# Patient Record
Sex: Male | Born: 2000 | Race: Black or African American | Hispanic: No | Marital: Single | Smoking: Never smoker
Health system: Southern US, Community
[De-identification: ages and names within clinical notes are randomized; demographics above are authoritative.]

## PROBLEM LIST (undated history)

## (undated) DIAGNOSIS — J45909 Unspecified asthma, uncomplicated: Secondary | ICD-10-CM

## (undated) DIAGNOSIS — Q213 Tetralogy of Fallot: Secondary | ICD-10-CM

## (undated) HISTORY — PX: WISDOM TOOTH EXTRACTION: SHX21

---
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
## 2022-01-27 NOTE — Progress Notes (Signed)
Cardiology Office Note ? ?Date:  01/28/2022  ? ?ID:  Chris Beck, DOB 11/30/2000, MRN [id number] ? ?PCP:  Dene Gentry, MD  ? ?Chief Complaint  ?Patient presents with  ? New Patient (Initial Visit)  ?  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  ?"Doing well." Medications reviewed by the patient verbally.    ? ?  ? ? ?HPI:  ?Mr. 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 ?Previously followed at [facility] pediatric congenital clinic ?Prior notes requested, reviewed ?Reports that he is very active, used to run track, continues his regular exercise program ?Denies any PND orthopnea, no chest pain on exertion no significant shortness of breath ? ?Last echo 01/2018 ?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  ?  No atrial shunt seen  ?  Mild right ventricular dilation with normal systolic function  ?  Normal left ventricualr size and systolic function  ?  No residual ventricular septal defect seen  ?  There is muscular narrowing in the right ventricular mid cavity with a peak gradient of  ?  22 mmHg  ?  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 ?The sinuses of Valsalva measure 38 mm. ? ?EKG personally reviewed by myself on todays visit ?Normal sinus rhythm rate 71 bpm right bundle branch block, RVH ? ? ? ?PMH:   has a past medical history of Asthma and Tetralogy of Fallot. ? ?PSH:   No past surgical history on  file. ? ?No current outpatient medications on file.  ? ?No current facility-administered medications for this visit.  ? ? ? ?Allergies:   Ibuprofen  ? ?Social History:  The patient  reports that he has never smoked. He has never used smokeless tobacco. He reports that he does not drink alcohol and does not use drugs.  ? ?Family History:   family history is not on file.  ? ? ?Review of Systems: ?Review of Systems  ?Constitutional: Negative.   ?HENT: Negative.    ?Respiratory: Negative.    ?Cardiovascular: Negative.   ?Gastrointestinal: Negative.   ?Musculoskeletal: Negative.   ?Neurological: Negative.   ?Psychiatric/Behavioral: Negative.    ?All other systems reviewed and are negative. ? ? ?PHYSICAL EXAM: ?VS:  BP 110/80 (BP Location: Right Arm, Patient Position: Sitting, Cuff Size: Normal)   Pulse 71   Ht 5\' 7"  (1.702 m)   Wt 128 lb (58.1 kg)   SpO2 99%   BMI 20.05 kg/m?  , BMI Body mass index is 20.05 kg/m?. ?GEN: Well nourished, well developed, in no acute distress ?HEENT: normal ?Neck: no JVD, carotid bruits, or masses ?Cardiac: RRR; 99991111 systolic ejection murmur left sternal border  ?No rubs, or gallops,no edema  ?Respiratory:  clear to auscultation bilaterally, normal work of breathing ?GI: soft, nontender, nondistended, + BS ?MS: no deformity or atrophy ?Skin: warm and dry, no rash ?Neuro:  Strength and sensation are intact ?Psych: euthymic mood, full affect ? ? ?  Recent Labs: ?No results found for requested labs within last 8760 hours.  ? ? ?Lipid Panel ?No results found for: CHOL, HDL, LDLCALC, TRIG ?  ? ?Wt Readings from Last 3 Encounters:  ?01/28/22 128 lb (58.1 kg) (10 %, Z= -1.27)*  ?07/27/18 131 lb (59.4 kg) (45 %, Z= -0.14)*  ? ?* Growth percentiles are based on CDC (Boys, 2-20 Years) data.  ?  ? ? ?ASSESSMENT AND PLAN: ? ?Problem List Items Addressed This Visit   ?None ?Visit Diagnoses   ? ? Tetralogy of Fallot s/p repair    -  Primary  ? Relevant Orders  ? ECHOCARDIOGRAM COMPLETE  ? Autism       ? Pulmonary valve insufficiency, unspecified etiology      ? ?  ? ?Prior diagnosis and surgery discussed, ?Has good exercise tolerance with no symptoms ?Denies edema, PND orthopnea ?Repeat echocardiogram has been ordered, last study 4 years ago ? ? ? Total encounter time more than 60 minutes ? Greater than 50% was spent in counseling and coordination of care with the patient ? ? ? ?Signed, ?Esmond Plants, M.D., Ph.D. ?[facility] [place], [place] ?[phone] ?

## 2022-01-28 ENCOUNTER — Ambulatory Visit (INDEPENDENT_AMBULATORY_CARE_PROVIDER_SITE_OTHER): Payer: Self-pay | Admitting: Cardiovascular Disease

## 2022-01-28 ENCOUNTER — Encounter: Payer: Self-pay | Admitting: Cardiovascular Disease

## 2022-01-28 ENCOUNTER — Other Ambulatory Visit: Payer: Self-pay

## 2022-01-28 VITALS — BP 110/80 | HR 71 | Ht 67.0 in | Wt 128.0 lb

## 2022-01-28 DIAGNOSIS — Z8774 Personal history of (corrected) congenital malformations of heart and circulatory system: Secondary | ICD-10-CM

## 2022-01-28 DIAGNOSIS — I371 Nonrheumatic pulmonary valve insufficiency: Secondary | ICD-10-CM

## 2022-01-28 DIAGNOSIS — F840 Autistic disorder: Secondary | ICD-10-CM

## 2022-01-28 NOTE — Patient Instructions (Addendum)
Echo for TOF/congenital heart disease, s/p correction ? ? ?Medication Instructions:  ?No changes ? ?If you need a refill on your cardiac medications before your next appointment, please call your pharmacy.  ? ?Lab work: ?No new labs needed ? ?Testing/Procedures: ? ?1) Echocardiogram: ?- Your physician has requested that you have an echocardiogram. Echocardiography is a painless test that uses sound waves to create images of your heart. It provides your doctor with information about the size and shape of your heart and how well your heart?s chambers and valves are working. This procedure takes approximately one hour. There are no restrictions for this procedure. There is a possibility that an IV may need to be started during your test to inject an image enhancing agent. This is done to obtain more optimal pictures of your heart. Therefore we ask that you do at least drink some water prior to coming in to hydrate your veins.  ? ? ?Follow-Up: ?At [facility], you and your health needs are our priority.  As part of our continuing mission to provide you with exceptional heart care, we have created designated Provider Care Teams.  These Care Teams include your primary Cardiologist (physician) and Advanced Practice Providers (APPs -  Physician Assistants and Nurse Practitioners) who all work together to provide you with the care you need, when you need it. ? ?You will need a follow up appointment in 12 months ? ?Providers on your designated Care Team:   ?Nicolasa Ducking, NP ?Eula Listen, PA-C ?Cadence Fransico Michael, PA-C ? ?COVID-19 Vaccine Information can be found at: [email or web address] For questions related to vaccine distribution or appointments, please email [email or web address] or call [phone].  ? ?

## 2022-02-01 NOTE — Addendum Note (Signed)
Addended byAlvis Lemmings C on: 02/01/2022 04:25 PM ? ? Modules accepted: Orders ? ?

## 2022-02-02 ENCOUNTER — Other Ambulatory Visit: Payer: Self-pay

## 2022-02-02 DIAGNOSIS — Z8774 Personal history of (corrected) congenital malformations of heart and circulatory system: Secondary | ICD-10-CM

## 2022-02-02 NOTE — Progress Notes (Signed)
Per Dr. Rockey Situ can we reschedule his echocardiogram to [facility] with attention to specific echo reader Harrington Challenger or Margaretann Loveless . Thx ? ?New order placed for the [place] location  ?

## 2022-02-07 NOTE — Addendum Note (Signed)
Addended by: Festus Aloe on: 02/07/2022 08:52 AM ? ? Modules accepted: Orders ? ?

## 2022-02-09 ENCOUNTER — Other Ambulatory Visit (HOSPITAL_COMMUNITY): Payer: Self-pay

## 2022-02-11 ENCOUNTER — Other Ambulatory Visit: Payer: Self-pay

## 2022-02-17 ENCOUNTER — Ambulatory Visit (HOSPITAL_COMMUNITY): Payer: Self-pay | Attending: Cardiovascular Disease

## 2022-02-17 DIAGNOSIS — Z8774 Personal history of (corrected) congenital malformations of heart and circulatory system: Secondary | ICD-10-CM | POA: Insufficient documentation

## 2022-02-17 DIAGNOSIS — I361 Nonrheumatic tricuspid (valve) insufficiency: Secondary | ICD-10-CM

## 2022-02-18 LAB — ECHOCARDIOGRAM COMPLETE
Area-P 1/2: 5.54 cm2
S' Lateral: 2.7 cm

## 2022-02-21 ENCOUNTER — Telehealth: Payer: Self-pay | Admitting: Emergency Medicine

## 2022-02-21 NOTE — Telephone Encounter (Signed)
Called and spoke with patient's mother, per DPR. Results reviewed with patient's mother, she verbalized understanding,  questions (if any) answered.   ?

## 2022-02-21 NOTE — Telephone Encounter (Signed)
-----   Message from Antonieta Iba, MD sent at 02/20/2022  1:43 PM EDT ----- ?Echocardiogram ?Normal ejection fraction ?Mild dilation of ascending aorta measured 42 mm ?Mild narrowing of left ventricular outflow tract ?No significant valve issues noted ?The above can be monitored with periodic echocardiogram  ?

## 2022-12-13 ENCOUNTER — Other Ambulatory Visit: Payer: Self-pay | Admitting: Family

## 2022-12-13 ENCOUNTER — Encounter: Payer: Self-pay | Admitting: Family

## 2022-12-13 DIAGNOSIS — M2140 Flat foot [pes planus] (acquired), unspecified foot: Secondary | ICD-10-CM | POA: Insufficient documentation

## 2022-12-13 DIAGNOSIS — L309 Dermatitis, unspecified: Secondary | ICD-10-CM | POA: Insufficient documentation

## 2022-12-13 DIAGNOSIS — K004 Disturbances in tooth formation: Secondary | ICD-10-CM | POA: Insufficient documentation

## 2022-12-13 DIAGNOSIS — F845 Asperger's syndrome: Secondary | ICD-10-CM | POA: Insufficient documentation

## 2022-12-13 DIAGNOSIS — J309 Allergic rhinitis, unspecified: Secondary | ICD-10-CM | POA: Insufficient documentation

## 2022-12-14 LAB — COMPREHENSIVE METABOLIC PANEL
ALT: 18 IU/L (ref 0–44)
AST: 29 IU/L (ref 0–40)
Albumin/Globulin Ratio: 2.3 — ABNORMAL HIGH (ref 1.2–2.2)
Albumin: 4.9 g/dL (ref 4.3–5.2)
Alkaline Phosphatase: 59 IU/L (ref 51–125)
BUN/Creatinine Ratio: 15 (ref 9–20)
BUN: 15 mg/dL (ref 6–20)
Bilirubin Total: 0.9 mg/dL (ref 0.0–1.2)
CO2: 25 mmol/L (ref 20–29)
Calcium: 9.6 mg/dL (ref 8.7–10.2)
Chloride: 102 mmol/L (ref 96–106)
Creatinine, Ser: 1.03 mg/dL (ref 0.76–1.27)
Globulin, Total: 2.1 g/dL (ref 1.5–4.5)
Glucose: 94 mg/dL (ref 70–99)
Potassium: 4.1 mmol/L (ref 3.5–5.2)
Sodium: 140 mmol/L (ref 134–144)
Total Protein: 7 g/dL (ref 6.0–8.5)
eGFR: 107 mL/min/{1.73_m2} (ref >59)

## 2022-12-14 LAB — CBC WITH DIFFERENTIAL/PLATELET
Basophils Absolute: 0.1 10*3/uL (ref 0.0–0.2)
Basos: 1 %
EOS (ABSOLUTE): 0.4 10*3/uL (ref 0.0–0.4)
Eos: 7 %
Hematocrit: 42 % (ref 37.5–51.0)
Hemoglobin: 14.2 g/dL (ref 13.0–17.7)
Immature Grans (Abs): 0 10*3/uL (ref 0.0–0.1)
Immature Granulocytes: 0 %
Lymphocytes Absolute: 2 10*3/uL (ref 0.7–3.1)
Lymphs: 34 %
MCH: 29.8 pg (ref 26.6–33.0)
MCHC: 33.8 g/dL (ref 31.5–35.7)
MCV: 88 fL (ref 79–97)
Monocytes Absolute: 0.5 10*3/uL (ref 0.1–0.9)
Monocytes: 9 %
Neutrophils Absolute: 2.9 10*3/uL (ref 1.4–7.0)
Neutrophils: 49 %
Platelets: 323 10*3/uL (ref 150–450)
RBC: 4.76 x10E6/uL (ref 4.14–5.80)
RDW: 11.9 % (ref 11.6–15.4)
WBC: 6 10*3/uL (ref 3.4–10.8)

## 2023-01-25 ENCOUNTER — Ambulatory Visit: Payer: Self-pay | Attending: Cardiovascular Disease | Admitting: Cardiovascular Disease

## 2023-01-25 ENCOUNTER — Encounter: Payer: Self-pay | Admitting: Cardiovascular Disease

## 2023-01-25 VITALS — BP 110/74 | HR 52 | Ht 67.0 in | Wt 128.4 lb

## 2023-01-25 DIAGNOSIS — F845 Asperger's syndrome: Secondary | ICD-10-CM

## 2023-01-25 DIAGNOSIS — Z8774 Personal history of (corrected) congenital malformations of heart and circulatory system: Secondary | ICD-10-CM

## 2023-01-25 DIAGNOSIS — I517 Cardiomegaly: Secondary | ICD-10-CM

## 2023-01-25 DIAGNOSIS — I371 Nonrheumatic pulmonary valve insufficiency: Secondary | ICD-10-CM

## 2023-01-25 DIAGNOSIS — I77810 Thoracic aortic ectasia: Secondary | ICD-10-CM

## 2023-01-25 DIAGNOSIS — F840 Autistic disorder: Secondary | ICD-10-CM

## 2023-01-25 DIAGNOSIS — I370 Nonrheumatic pulmonary valve stenosis: Secondary | ICD-10-CM

## 2023-01-25 NOTE — Patient Instructions (Signed)
Medication Instructions:  No changes  If you need a refill on your cardiac medications before your next appointment, please call your pharmacy.   Lab work: No new labs needed  Testing/Procedures: No new testing needed  Follow-Up: At [facility], you and your health needs are our priority.  As part of our continuing mission to provide you with exceptional heart care, we have created designated Provider Care Teams.  These Care Teams include your primary Cardiologist (physician) and Advanced Practice Providers (APPs -  Physician Assistants and Nurse Practitioners) who all work together to provide you with the care you need, when you need it.  You will need a follow up appointment in 12 months  Providers on your designated Care Team:   Christopher Berge, NP Ryan Dunn, PA-C Cadence Furth, PA-C  COVID-19 Vaccine Information can be found at: [email or web address] For questions related to vaccine distribution or appointments, please email [email or web address] or call [phone].   

## 2023-01-25 NOTE — Progress Notes (Signed)
Cardiology Office Note  Date:  01/25/2023   ID:  ASHUTOSH SIRIGNANO, DOB 11/04/2001, MRN [id number]  PCP:  Mechele Claude, FNP   Chief Complaint  Patient presents with   12 month follow up     "Doing well." Medications reviewed by the patient verbally.     HPI:  Mr. 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    Who presents for new patient evaluation of his tetralogy of Fallot, status post repair  Previously followed at [facility] pediatric congenital clinic In follow-up today reports that he continues to be active, exercises regularly In college, likes to run but not participating in a sports club or program Denies significant shortness of breath on exertion, no chest pain, no leg swelling PND orthopnea  Echocardiogram April 2023 through our system Mild dilate ascending aorta 4.2 cm Mild narrowing left ventricular outflow tract  echo 01/2018 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  EKG personally reviewed by myself on todays visit Normal sinus rhythm rate 52 bpm right bundle branch block, RVH No significant change from prior EKG 2023 PMH:   has a past medical history of Asthma and Tetralogy of Fallot.  PSH:   History reviewed. No pertinent surgical history.  No current outpatient medications on file.   No  current facility-administered medications for this visit.     Allergies:   Ibuprofen   Social History:  The patient  reports that he has never smoked. He has never used smokeless tobacco. He reports that he does not drink alcohol and does not use drugs.   Family History:   family history is not on file.    Review of Systems: Review of Systems  Constitutional: Negative.   HENT: Negative.    Respiratory: Negative.    Cardiovascular: Negative.   Gastrointestinal: Negative.   Musculoskeletal: Negative.   Neurological: Negative.   Psychiatric/Behavioral: Negative.    All other systems reviewed and are negative.   PHYSICAL EXAM: VS:  BP 110/74 (BP Location: Left Arm, Patient Position: Sitting, Cuff Size: Normal)   Pulse (!) 52   Ht '5\' 7"'$  (1.702 m)   Wt 128 lb 6 oz (58.2 kg)   SpO2 98%   BMI 20.11 kg/m  , BMI Body mass index is 20.11 kg/m. Constitutional:  oriented to person, place, and time. No distress.  HENT:  Head: Grossly normal Eyes:  no discharge. No scleral icterus.  Neck: No JVD, no carotid bruits  Cardiovascular: RRR; 3/6 systolic ejection murmur left sternal border  Pulmonary/Chest: Clear to auscultation bilaterally, no wheezes or rails Abdominal: Soft.  no distension.  no tenderness.  Musculoskeletal: Normal range of motion Neurological:  normal muscle tone. Coordination normal. No atrophy Skin: Skin warm and dry Psychiatric: normal affect, pleasant   Recent Labs: 12/13/2022: ALT 18;  BUN 15; Creatinine, Ser 1.03; Hemoglobin 14.2; Platelets 323; Potassium 4.1; Sodium 140    Lipid Panel No results found for: "CHOL", "HDL", "LDLCALC", "TRIG"    Wt Readings from Last 3 Encounters:  01/25/23 128 lb 6 oz (58.2 kg)  01/28/22 128 lb (58.1 kg) (10 %, Z= -1.27)*  07/27/18 131 lb (59.4 kg) (45 %, Z= -0.14)*   * Growth percentiles are based on CDC (Boys, 2-20 Years) data.      ASSESSMENT AND PLAN:  Problem List Items Addressed This Visit       Cardiology  Problems   Aortic root dilatation [facility])   Pulmonary stenosis   Right ventricular dilation     Other   Asperger's disorder   Other Visit Diagnoses     Tetralogy of Fallot s/p repair    -  Primary   Autism       Pulmonary valve insufficiency, unspecified etiology          Tetralogy of Fallot status postrepair Presents today with his mother, reports no new symptoms concerning for progression of his cardiac disease Prior echocardiogram from April 2023 reviewed with him Discussed outflow tract gradient, pulmonic valve regurgitation, mild aortic root dilation good exercise tolerance with no new complaints Currently with no insurance, would prefer no repeat echo testing at this time Discussed discussed symptoms to watch for including PND orthopnea, leg swelling abdominal distention, worsening shortness of breath on exertion  Autism/asperger disorder Completing college August 2024 Enjoys writing   Total encounter time more than 30 minutes  Greater than 50% was spent in counseling and coordination of care with the patient    Signed, Chris Beck, M.D., Ph.D. [place], [place]

## 2023-02-03 ENCOUNTER — Ambulatory Visit: Payer: Self-pay | Admitting: Family

## 2023-02-03 VITALS — BP 112/62 | HR 63 | Ht 69.0 in | Wt 127.0 lb

## 2023-02-03 DIAGNOSIS — L308 Other specified dermatitis: Secondary | ICD-10-CM

## 2023-02-03 MED ORDER — TRIAMCINOLONE ACETONIDE 0.5 % EX CREA: 1.0000 | TOPICAL_CREAM | Freq: Two times a day (BID) | CUTANEOUS | 0 refills | Status: AC

## 2023-02-03 NOTE — Progress Notes (Signed)
Established Patient Office Visit  Subjective:  Patient ID: Chris Beck, male    DOB: 2001-10-25  Age: 22 y.o. MRN: [id number]  Chief Complaint  Patient presents with   Follow-up    Rash around eyes    Patient here with a rash on his face.  He says that he noticed some bumps yesterday, but that it worsened into a small rash at the outer corners of his eyes that has become quite itchy.  He does have a history of eczema, and the rash appears consistent with this.  It has however been several years since he has had any issues with this.   No other concerns today.     No other concerns at this time.   Past Medical History:  Diagnosis Date   Asthma    Tetralogy of Fallot     History reviewed. No pertinent surgical history.  Social History   Socioeconomic History   Marital status: Single    Spouse name: Not on file   Number of children: Not on file   Years of education: Not on file   Highest education level: Not on file  Occupational History   Not on file  Tobacco Use   Smoking status: Never   Smokeless tobacco: Never  Vaping Use   Vaping Use: Never used  Substance and Sexual Activity   Alcohol use: Never   Drug use: Never   Sexual activity: Never  Other Topics Concern   Not on file  Social History Narrative   Not on file   Social Determinants of Health   Financial Resource Strain: Not on file  Food Insecurity: Not on file  Transportation Needs: Not on file  Physical Activity: Not on file  Stress: Not on file  Social Connections: Not on file  Intimate Partner Violence: Not on file    History reviewed. No pertinent family history.  Allergies  Allergen Reactions   Ibuprofen Other (See Comments)    Rash  Other Reaction(s): Toxic shock syndrome    Review of Systems  Skin:  Positive for itching and rash.  All other systems reviewed and are negative.      Objective:   BP 112/62   Pulse 63   Ht 5\' 9"  (1.753 m)   Wt 127 lb (57.6 kg)    SpO2 99%   BMI 18.75 kg/m   Vitals:   02/03/23 1431  BP: 112/62  Pulse: 63  Height: 5\' 9"  (1.753 m)  Weight: 127 lb (57.6 kg)  SpO2: 99%  BMI (Calculated): 18.75    Physical Exam Vitals and nursing note reviewed.  Constitutional:      Appearance: Normal appearance. He is normal weight.  Eyes:     Pupils: Pupils are equal, round, and reactive to light.  Cardiovascular:     Rate and Rhythm: Normal rate and regular rhythm.     Pulses: Normal pulses.     Heart sounds: Normal heart sounds.  Pulmonary:     Effort: Pulmonary effort is normal.     Breath sounds: Normal breath sounds.  Musculoskeletal:     Cervical back: Normal range of motion.  Skin:    Findings: Rash (located at outer corners of eyes. Appears dry and flaky.) present.  Neurological:     General: No focal deficit present.     Mental Status: He is alert and oriented to person, place, and time. Mental status is at baseline.  Psychiatric:        Mood and  Affect: Mood normal.        Behavior: Behavior normal.      No results found for any visits on 02/03/23.  Recent Results (from the past 2160 hour(s))  CBC with Differential/Platelet     Status: None   Collection Time: 12/13/22  3:39 PM  Result Value Ref Range   WBC 6.0 3.4 - 10.8 x10E3/uL   RBC 4.76 4.14 - 5.80 x10E6/uL   Hemoglobin 14.2 13.0 - 17.7 g/dL   Hematocrit 42.0 37.5 - 51.0 %   MCV 88 79 - 97 fL   MCH 29.8 26.6 - 33.0 pg   MCHC 33.8 31.5 - 35.7 g/dL   RDW 11.9 11.6 - 15.4 %   Platelets 323 150 - 450 x10E3/uL   Neutrophils 49 Not Estab. %   Lymphs 34 Not Estab. %   Monocytes 9 Not Estab. %   Eos 7 Not Estab. %   Basos 1 Not Estab. %   Neutrophils Absolute 2.9 1.4 - 7.0 x10E3/uL   Lymphocytes Absolute 2.0 0.7 - 3.1 x10E3/uL   Monocytes Absolute 0.5 0.1 - 0.9 x10E3/uL   EOS (ABSOLUTE) 0.4 0.0 - 0.4 x10E3/uL   Basophils Absolute 0.1 0.0 - 0.2 x10E3/uL   Immature Granulocytes 0 Not Estab. %   Immature Grans (Abs) 0.0 0.0 - 0.1 x10E3/uL   Comprehensive metabolic panel     Status: Abnormal   Collection Time: 12/13/22  3:39 PM  Result Value Ref Range   Glucose 94 70 - 99 mg/dL   BUN 15 6 - 20 mg/dL   Creatinine, Ser 1.03 0.76 - 1.27 mg/dL   eGFR 107 >59 mL/min/1.73   BUN/Creatinine Ratio 15 9 - 20   Sodium 140 134 - 144 mmol/L   Potassium 4.1 3.5 - 5.2 mmol/L   Chloride 102 96 - 106 mmol/L   CO2 25 20 - 29 mmol/L   Calcium 9.6 8.7 - 10.2 mg/dL   Total Protein 7.0 6.0 - 8.5 g/dL   Albumin 4.9 4.3 - 5.2 g/dL   Globulin, Total 2.1 1.5 - 4.5 g/dL   Albumin/Globulin Ratio 2.3 (H) 1.2 - 2.2   Bilirubin Total 0.9 0.0 - 1.2 mg/dL   Alkaline Phosphatase 59 51 - 125 IU/L   AST 29 0 - 40 IU/L   ALT 18 0 - 44 IU/L      Assessment & Plan:   Problem List Items Addressed This Visit     Eczema - Primary    Return as needed..   Total time spent: 20 minutes  Mechele Claude, FNP  02/03/2023

## 2023-02-04 ENCOUNTER — Encounter: Payer: Self-pay | Admitting: Family

## 2023-08-10 ENCOUNTER — Ambulatory Visit: Payer: Self-pay | Admitting: Family

## 2023-12-14 ENCOUNTER — Ambulatory Visit: Payer: Self-pay | Admitting: Family

## 2024-03-02 NOTE — Progress Notes (Unsigned)
 Cardiology Clinic Note   Date: 03/04/2024 ID: Chris Beck, DOB 2001/03/25, MRN [id number]  Primary Cardiologist:  Belva Boyden, MD  Chief Complaint   Chris Beck is a 23 y.o. male who presents to the clinic today for routine follow up.   Patient Profile   Chris Beck is followed by Dr. Gollan for the history outlined below.      Past medical history significant for: Thoracic ascending aortic dilatation/aortic root dilatation. Echo 02/17/2022: Septal hypokinesis.  EF 55%.  Indeterminate diastolic parameters.  Low normal RV function, normal RV size.  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  Mild narrowing through RVOT peak and mean gradients through the RVOT/pulmonic valve 16 and 7 mmHg respectively.  Aortic dilatation noted 38 mm.  Mild dilatation of ascending aorta 42 mm. 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  Asperger's.  In summary, patient was first evaluated by Dr. Gollan on 01/28/2022 to establish cardiac care.  Patient with a history of repair of tetralogy of Fallot in April 2024.  He was previously followed by [facility].  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  Patient was last seen in the office by Dr. Gollan on 01/25/2023 for routine follow-up.  He was doing well at that time and no changes were made.     History of Present Illness    Today, patient is accompanied by his mother. He is doing very well. Patient denies shortness of breath, dyspnea on exertion, lower extremity edema, orthopnea or PND. No chest pain, pressure, or tightness. No palpitations.  He is active doing cardio and weight training 4-5 times a week. He works as a Merchandiser, retail at [employer].  He is no longer vaping and iw working on improving his diet.     ROS: All other systems reviewed and are otherwise negative except as noted in History of Present Illness.  EKGs/Labs Reviewed    EKG Interpretation Date/Time:  Monday March 04 2024 15:04:43 EDT Ventricular Rate:  59 PR Interval:  122 QRS Duration:  152 QT Interval:  438 QTC Calculation: 433 R Axis:   200  Text Interpretation: Sinus bradycardia Right bundle branch block , plus right ventricular hypertrophy Previous EKG 01/26/2023 (not in [facility]) No signfiicant changes Confirmed by Morey Ar [phone]) on 03/04/2024 3:23:57 PM    Physical Exam    VS:  BP 104/72   Pulse (!) 59   Ht 5\' 7"  (1.702 m)   Wt 125 lb (56.7 kg)   SpO2 98%   BMI 19.58 kg/m  , BMI Body mass index is 19.58 kg/m.  GEN: Well nourished, well developed, in no acute distress. Neck: No JVD or carotid bruits. Cardiac:  RRR. 3/6 systolic murmur. No rubs or gallops.   Respiratory:  Respirations regular and unlabored. Clear to auscultation without rales, wheezing or rhonchi. GI: Soft, nontender, nondistended. Extremities: Radials/DP/PT 2+ and equal bilaterally. No clubbing or cyanosis. No edema.  Skin: Warm and dry, no rash. Neuro: Strength intact.  Assessment & Plan   Thoracic ascending aortic dilatation/aortic root dilatation/history of tetralogy of Fallot S/p repair of tetralogy of Fallot April 2024.  Echo April 2023 as detailed in patient profile.  Patient denies shortness of breath, DOE, lower extremity edema,  orthopnea, PND, abdominal fullness/distention. He is very active doing cardio and weight training 4-5 times a week. He also works as a Merchandiser, retail at [employer].  -Discussed concerning signs to contact the office about.  - Repeat echo as clinically indicated.   Disposition: Return in 1 year or sooner as needed.          Signed, Lonell Rives. Joretta Eads, DNP, NP-C

## 2024-03-04 ENCOUNTER — Encounter: Payer: Self-pay | Admitting: Student

## 2024-03-04 ENCOUNTER — Ambulatory Visit: Payer: Self-pay | Attending: Student | Admitting: Student

## 2024-03-04 VITALS — BP 104/72 | HR 59 | Ht 67.0 in | Wt 125.0 lb

## 2024-03-04 DIAGNOSIS — Z8774 Personal history of (corrected) congenital malformations of heart and circulatory system: Secondary | ICD-10-CM

## 2024-03-04 DIAGNOSIS — I517 Cardiomegaly: Secondary | ICD-10-CM

## 2024-03-04 DIAGNOSIS — I7121 Aneurysm of the ascending aorta, without rupture: Secondary | ICD-10-CM

## 2024-03-04 DIAGNOSIS — I77810 Thoracic aortic ectasia: Secondary | ICD-10-CM

## 2024-03-04 NOTE — Patient Instructions (Signed)
 Medication Instructions:  No changes at this time.   *If you need a refill on your cardiac medications before your next appointment, please call your pharmacy*  Lab Work: None  If you have labs (blood work) drawn today and your tests are completely normal, you will receive your results only by: MyChart Message (if you have MyChart) OR A paper copy in the mail If you have any lab test that is abnormal or we need to change your treatment, we will call you to review the results.  Testing/Procedures: None  Follow-Up: At [facility], you and your health needs are our priority.  As part of our continuing mission to provide you with exceptional heart care, our providers are all part of one team.  This team includes your primary Cardiologist (physician) and Advanced Practice Providers or APPs (Physician Assistants and Nurse Practitioners) who all work together to provide you with the care you need, when you need it.  Your next appointment:   1 year(s)  Provider:   Morey Ar, NP

## 2024-03-05 ENCOUNTER — Ambulatory Visit: Payer: Self-pay | Admitting: Cardiovascular Disease
